# Patient Record
Sex: Male | Born: 2000 | Race: Black or African American | Hispanic: No | Marital: Single | Smoking: Never smoker
Health system: Southern US, Community
[De-identification: ages and names within clinical notes are randomized; demographics above are authoritative.]

## PROBLEM LIST (undated history)

## (undated) HISTORY — PX: NO PAST SURGERIES: SHX2092

## (undated) HISTORY — PX: CIRCUMCISION: SHX1350

---
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
## 2021-02-24 DEATH — deceased
